# Patient Record
Sex: Male | Born: 1965 | Race: White | Hispanic: No | Marital: Married | Smoking: Current every day smoker
Health system: Southern US, Community
[De-identification: ages and names within clinical notes are randomized; demographics above are authoritative.]

---

## 2004-10-04 ENCOUNTER — Emergency Department: Payer: Self-pay | Admitting: General Practice

## 2014-09-04 ENCOUNTER — Emergency Department: Payer: Self-pay | Admitting: Emergency Medicine

## 2020-03-06 ENCOUNTER — Emergency Department
Admission: EM | Admit: 2020-03-06 | Discharge: 2020-03-06 | Disposition: A | Discharge status: Home / Self Care | Payer: Medicaid Other | Attending: Student | Admitting: Student

## 2020-03-06 ENCOUNTER — Emergency Department: Payer: Medicaid Other

## 2020-03-06 ENCOUNTER — Other Ambulatory Visit: Payer: Self-pay

## 2020-03-06 DIAGNOSIS — S59902A Unspecified injury of left elbow, initial encounter: Secondary | ICD-10-CM | POA: Diagnosis not present

## 2020-03-06 DIAGNOSIS — Y939 Activity, unspecified: Secondary | ICD-10-CM | POA: Insufficient documentation

## 2020-03-06 DIAGNOSIS — Y999 Unspecified external cause status: Secondary | ICD-10-CM | POA: Diagnosis not present

## 2020-03-06 DIAGNOSIS — W010XXA Fall on same level from slipping, tripping and stumbling without subsequent striking against object, initial encounter: Secondary | ICD-10-CM | POA: Insufficient documentation

## 2020-03-06 DIAGNOSIS — Y929 Unspecified place or not applicable: Secondary | ICD-10-CM | POA: Diagnosis not present

## 2020-03-06 DIAGNOSIS — M25422 Effusion, left elbow: Secondary | ICD-10-CM

## 2020-03-06 MED ORDER — TRAMADOL HCL 50 MG PO TABS: 50.0000 mg | ORAL_TABLET | Freq: Two times a day (BID) | ORAL | 0 refills | Status: AC

## 2020-03-06 MED ORDER — CYCLOBENZAPRINE HCL 5 MG PO TABS
5.0000 mg | ORAL_TABLET | Freq: Three times a day (TID) | ORAL | 0 refills | Status: DC | PRN
Start: 2020-03-06 — End: 2020-03-25

## 2020-03-06 NOTE — ED Triage Notes (Signed)
Pt comes via POV from home with c/o left elbow pain. Pt states he tripped and fell on it and hurt it .  Pt states pain and swelling.

## 2020-03-06 NOTE — Discharge Instructions (Addendum)
Your exam and XR are negative for a fracture. You have fluid around the elbow joint. You should wear the splint for comfort and apply ice to reduce swelling. Follow-up with Ortho for ongoing symptoms.

## 2020-03-06 NOTE — ED Notes (Signed)
See triage note. Pt ambulatory to room. Pt stating he fell on his left arm Monday and since the pain has increased. Pt in NAD at this time. Pt rating pain 9/10.

## 2020-03-06 NOTE — ED Provider Notes (Signed)
[facility] Emergency Department Provider Note ____________________________________________  Time seen: 21  I have reviewed the triage vital signs and the nursing notes.  HISTORY  Chief Complaint  left arm pain  HPI Richard Moran is a 53 y.o. male presents himself to the ED for evaluation of left elbow pain following a mechanical fall.   Patient describes he fell on his left elbow, after he tripped and fell.  He presents with pain and swelling to the elbow.  He denies any head injury, loss of consciousness, distal paresthesias, grip changes.  No other injuries are reported at this time.  History reviewed. No pertinent past medical history.  There are no problems to display for this patient.   History reviewed. No pertinent surgical history.  Prior to Admission medications   Medication Sig Start Date End Date Taking? Authorizing Provider  cyclobenzaprine (FLEXERIL) 5 MG tablet Take 1 tablet (5 mg total) by mouth 3 (three) times daily as needed. 03/06/20   Ladamien Rammel, Charlesetta Ivory, PA-C  traMADol (ULTRAM) 50 MG tablet Take 1 tablet (50 mg total) by mouth 2 (two) times daily for 5 days. 03/06/20 03/11/20  Amaurie Schreckengost, Charlesetta Ivory, PA-C    Allergies Patient has no known allergies.  No family history on file.  Social History Social History   Tobacco Use  . Smoking status: Not on file  Substance Use Topics  . Alcohol use: Not on file  . Drug use: Not on file    Review of Systems  Constitutional: Negative for fever. Eyes: Negative for visual changes. ENT: Negative for sore throat. Cardiovascular: Negative for chest pain. Respiratory: Negative for shortness of breath. Gastrointestinal: Negative for abdominal pain, vomiting and diarrhea. Genitourinary: Negative for dysuria. Musculoskeletal: Negative for back pain.  Left elbow pain as above. Skin: Negative for rash. Neurological: Negative for headaches, focal weakness or  numbness. ____________________________________________  PHYSICAL EXAM:  VITAL SIGNS: ED Triage Vitals [03/06/20 1749]  Enc Vitals Group     BP 125/84     Pulse Rate 95     Resp 18     Temp 98.2 F (36.8 C)     Temp src      SpO2 96 %     Weight 145 lb (65.8 kg)     Height 5\' 9"  (1.753 m)     Head Circumference      Peak Flow      Pain Score 10     Pain Loc      Pain Edu?      Excl. in [place]?     Constitutional: Alert and oriented. Well appearing and in no distress. Head: Normocephalic and atraumatic. Eyes: Conjunctivae are normal. Normal extraocular movements Cardiovascular: Normal rate, regular rhythm. Normal distal pulses. Respiratory: Normal respiratory effort.  Musculoskeletal: Left elbow without any obvious deformity or dislocation.  Patient does have a small joint effusion appreciated.  Pain is increased with attempts to fully extend the elbow.  Normal composite fist distally.  Normal pronation and supination range of the forearm.  Nontender with normal range of motion in all extremities.  Neurologic: Cranial nerves II through XII grossly intact.  Normal intrinsic and opposition testing noted.  Normal gait without ataxia. Normal speech and language. No gross focal neurologic deficits are appreciated. Skin:  Skin is warm, dry and intact. No rash noted. ____________________________________________   RADIOLOGY  DG Left Elbow IMPRESSION: 1. Elbow effusion, without evidence of displaced fracture. 2. Dorsal soft tissue swelling. ____________________________________________  PROCEDURES  Jones-Watson wrap to the elbow.  Procedures ____________________________________________  INITIAL IMPRESSION / ASSESSMENT AND PLAN / ED COURSE  Patient with ED evaluation of left elbow pain and swelling after mechanical fall.  Patient's x-rays negative for any acute fracture or dislocation.  Patient does have a mild elbow effusion and some underlying osteophytes of the elbow.  He is  placed in a Jones Watson wrap to the elbow for comfort.  He is referred to Ortho for ongoing management of his symptoms.  Prescriptions for cyclobenzaprine and tramadol are provided for his benefit peer return precautions have been reviewed.  Richard Moran was evaluated in Emergency Department on 03/08/2020 for the symptoms described in the history of present illness. He was evaluated in the context of the global COVID-19 pandemic, which necessitated consideration that the patient might be at risk for infection with the SARS-CoV-2 virus that causes COVID-19. Institutional protocols and algorithms that pertain to the evaluation of patients at risk for COVID-19 are in a state of rapid change based on information released by regulatory bodies including the CDC and federal and state organizations. These policies and algorithms were followed during the patient's care in the ED.  I reviewed the patient's prescription history over the last 12 months in the multi-state controlled substances database(s) that includes [place], [place], [place], [place], [place], [place], [place], [place], [place], [place], [place], [place], [place], and [place].  Results were notable for no RX history.  ____________________________________________  FINAL CLINICAL IMPRESSION(S) / ED DIAGNOSES  Final diagnoses:  Elbow effusion, left  Elbow injury, left, initial encounter      Melvenia Needles, PA-C 03/08/20 0039    Lilia Pro., MD 03/08/20 [phone]

## 2020-03-25 ENCOUNTER — Emergency Department
Admission: EM | Admit: 2020-03-25 | Discharge: 2020-03-25 | Disposition: A | Discharge status: Home / Self Care | Payer: Medicaid Other | Attending: Emergency Medicine | Admitting: Emergency Medicine

## 2020-03-25 ENCOUNTER — Encounter: Payer: Self-pay | Admitting: Emergency Medicine

## 2020-03-25 ENCOUNTER — Other Ambulatory Visit: Payer: Self-pay

## 2020-03-25 ENCOUNTER — Emergency Department: Payer: Medicaid Other

## 2020-03-25 DIAGNOSIS — S4991XA Unspecified injury of right shoulder and upper arm, initial encounter: Secondary | ICD-10-CM | POA: Diagnosis present

## 2020-03-25 DIAGNOSIS — F17210 Nicotine dependence, cigarettes, uncomplicated: Secondary | ICD-10-CM | POA: Diagnosis not present

## 2020-03-25 DIAGNOSIS — M25511 Pain in right shoulder: Secondary | ICD-10-CM | POA: Diagnosis not present

## 2020-03-25 DIAGNOSIS — W1830XA Fall on same level, unspecified, initial encounter: Secondary | ICD-10-CM | POA: Diagnosis not present

## 2020-03-25 DIAGNOSIS — Y999 Unspecified external cause status: Secondary | ICD-10-CM | POA: Insufficient documentation

## 2020-03-25 DIAGNOSIS — Y93K1 Activity, walking an animal: Secondary | ICD-10-CM | POA: Diagnosis not present

## 2020-03-25 DIAGNOSIS — Y929 Unspecified place or not applicable: Secondary | ICD-10-CM | POA: Diagnosis not present

## 2020-03-25 MED ORDER — LIDOCAINE 5 % EX PTCH
1.0000 | MEDICATED_PATCH | CUTANEOUS | Status: DC
  Administered 2020-03-25: 1 via TRANSDERMAL
  Filled 2020-03-25: qty 1

## 2020-03-25 MED ORDER — NAPROXEN 500 MG PO TABS: 500.0000 mg | ORAL_TABLET | Freq: Two times a day (BID) | ORAL | Status: AC

## 2020-03-25 MED ORDER — TRAMADOL HCL 50 MG PO TABS: 50.0000 mg | ORAL_TABLET | Freq: Four times a day (QID) | ORAL | 0 refills | Status: AC | PRN

## 2020-03-25 NOTE — ED Provider Notes (Signed)
[facility] Emergency Department Provider Note   ____________________________________________   First MD Initiated Contact with Patient 03/25/20 1029     (approximate)  I have reviewed the triage vital signs and the nursing notes.   HISTORY  Chief Complaint Shoulder Pain    HPI Richard Moran is a 54 y.o. male patient complain of right shoulder pain secondary to fall yesterday.  Patient state he was walking his dog when his feet slipped out from under him patient that he landed on his right shoulder.  Patient denies LOC or head injury.  Rates pain as a 4/10.  Described pain is "achy".  No palliative measure prior to arrival.         History reviewed. No pertinent past medical history.  There are no problems to display for this patient.   History reviewed. No pertinent surgical history.  Prior to Admission medications   Medication Sig Start Date End Date Taking? Authorizing Provider  cyclobenzaprine (FLEXERIL) 5 MG tablet Take 1 tablet (5 mg total) by mouth 3 (three) times daily as needed. 03/06/20   Menshew, Dannielle Karvonen, PA-C  naproxen (NAPROSYN) 500 MG tablet Take 1 tablet (500 mg total) by mouth 2 (two) times daily with a meal. 03/25/20   Sable Feil, PA-C  traMADol (ULTRAM) 50 MG tablet Take 1 tablet (50 mg total) by mouth every 6 (six) hours as needed for moderate pain. 03/25/20   Sable Feil, PA-C    Allergies Patient has no known allergies.  No family history on file.  Social History Social History   Tobacco Use  . Smoking status: Current Every Day Smoker  . Smokeless tobacco: Never Used  Substance Use Topics  . Alcohol use: Yes  . Drug use: Not on file    Review of Systems Constitutional: No fever/chills Eyes: No visual changes. ENT: No sore throat. Cardiovascular: Denies chest pain. Respiratory: Denies shortness of breath. Gastrointestinal: No abdominal pain.  No nausea, no vomiting.  No diarrhea.  No  constipation. Genitourinary: Negative for dysuria. Musculoskeletal: Right shoulder pain. Skin: Negative for rash. Neurological: Negative for headaches, focal weakness or numbness.   ____________________________________________   PHYSICAL EXAM:  VITAL SIGNS: ED Triage Vitals [03/25/20 0929]  Enc Vitals Group     BP (!) 142/75     Pulse Rate (!) 59     Resp 18     Temp 98.3 F (36.8 C)     Temp Source Oral     SpO2 98 %     Weight 150 lb (68 kg)     Height 6' (1.829 m)     Head Circumference      Peak Flow      Pain Score      Pain Loc      Pain Edu?      Excl. in [place]?     Constitutional: Alert and oriented. Well appearing and in no acute distress. Cardiovascular: Normal rate, regular rhythm. Grossly normal heart sounds.  Good peripheral circulation. Respiratory: Normal respiratory effort.  No retractions. Lungs CTAB. Musculoskeletal: No obvious deformity to the right shoulder.  Patient decreased range of motion with abduction and overhead reaching.  Moderate guarding palpation GH joint.   Neurologic:  Normal speech and language. No gross focal neurologic deficits are appreciated. No gait instability. Skin:  Skin is warm, dry and intact. No rash noted. Psychiatric: Mood and affect are normal. Speech and behavior are normal.  ____________________________________________   LABS (all labs  ordered are listed, but only abnormal results are displayed)  Labs Reviewed - No data to display ____________________________________________  EKG   ____________________________________________  RADIOLOGY  ED MD interpretation:    Official radiology report(s): DG Shoulder Right  Result Date: 03/25/2020 CLINICAL DATA:  Right shoulder pain after fall EXAM: RIGHT SHOULDER - 2+ VIEW COMPARISON:  None. FINDINGS: Suspected spurring along the inferior glenoid, spurring is favored. No appreciable fracture. No malalignment at the [facility] joint. Mild spurring at the [facility] joint. IMPRESSION:  Glenohumeral and mild AC joint spurring.  No acute findings. Electronically Signed   By: Gaylyn Rong M.D.   On: 03/25/2020 11:03    ____________________________________________   PROCEDURES  Procedure(s) performed (including Critical Care):  Procedures   ____________________________________________   INITIAL IMPRESSION / ASSESSMENT AND PLAN / ED COURSE  As part of my medical decision making, I reviewed the following data within the electronic MEDICAL RECORD NUMBER     Patient presents with right shoulder pain secondary to a fall last night.  Discussed no acute findings on x-ray of the right shoulder.  Patient placed in arm sling and a Lidoderm patch applied to area of concern.  Patient given discharge care instruction advised take medication as directed.  Patient advised [facility] care with [facility].  Return to ED if condition worsens.    Richard Moran was evaluated in Emergency Department on 03/25/2020 for the symptoms described in the history of present illness. He was evaluated in the context of the global COVID-19 pandemic, which necessitated consideration that the patient might be at risk for infection with the SARS-CoV-2 virus that causes COVID-19. Institutional protocols and algorithms that pertain to the evaluation of patients at risk for COVID-19 are in a state of rapid change based on information released by regulatory bodies including the CDC and federal and state organizations. These policies and algorithms were followed during the patient's care in the ED.       ____________________________________________   FINAL CLINICAL IMPRESSION(S) / ED DIAGNOSES  Final diagnoses:  Acute pain of right shoulder     ED Discharge Orders         Ordered    naproxen (NAPROSYN) 500 MG tablet  2 times daily with meals     Discontinue  Reprint     03/25/20 1126    traMADol (ULTRAM) 50 MG tablet  Every 6 hours PRN     Discontinue  Reprint     03/25/20 1126             Note:  This document was prepared using Dragon voice recognition software and may include unintentional dictation errors.    Joni Reining, PA-C 03/25/20 1130    Dionne Bucy, MD 03/25/20 1439

## 2020-03-25 NOTE — ED Notes (Signed)
See triage note  Presents w/p fall  States he was trying to get his dog  And fell  Landed on right shoulder

## 2020-03-25 NOTE — ED Triage Notes (Signed)
Patient to ER for c/o right shoulder pain after fall yesterday. Patient states he was trying to get control of his dog while walking dog and feet slipped out from under him. Patient denies hitting head, reports landing on right shoulder. Patient ambulatory to triage without difficulty.

## 2020-03-25 NOTE — Discharge Instructions (Addendum)
No acute findings on x-ray of the right shoulder today.  Follow discharge care instruction Wear arm sling.  Take medication as directed.

## 2022-03-13 ENCOUNTER — Emergency Department
Admission: EM | Admit: 2022-03-13 | Discharge: 2022-03-13 | Disposition: A | Discharge status: Home / Self Care | Payer: Medicaid Other | Attending: Emergency Medicine | Admitting: Emergency Medicine

## 2022-03-13 ENCOUNTER — Other Ambulatory Visit: Payer: Self-pay

## 2022-03-13 DIAGNOSIS — K2970 Gastritis, unspecified, without bleeding: Secondary | ICD-10-CM | POA: Insufficient documentation

## 2022-03-13 DIAGNOSIS — K2900 Acute gastritis without bleeding: Secondary | ICD-10-CM

## 2022-03-13 DIAGNOSIS — F17200 Nicotine dependence, unspecified, uncomplicated: Secondary | ICD-10-CM | POA: Diagnosis not present

## 2022-03-13 DIAGNOSIS — R112 Nausea with vomiting, unspecified: Secondary | ICD-10-CM | POA: Diagnosis present

## 2022-03-13 LAB — CBC
HCT: 46.3 % (ref 39.0–52.0)
Hemoglobin: 16.3 g/dL (ref 13.0–17.0)
MCH: 34.8 pg — ABNORMAL HIGH (ref 26.0–34.0)
MCHC: 35.2 g/dL (ref 30.0–36.0)
MCV: 98.7 fL (ref 80.0–100.0)
Platelets: 275 10*3/uL (ref 150–400)
RBC: 4.69 MIL/uL (ref 4.22–5.81)
RDW: 11.8 % (ref 11.5–15.5)
WBC: 7.5 10*3/uL (ref 4.0–10.5)
nRBC: 0 % (ref 0.0–0.2)

## 2022-03-13 LAB — HEPATIC FUNCTION PANEL
ALT: 70 U/L — ABNORMAL HIGH (ref 0–44)
AST: 66 U/L — ABNORMAL HIGH (ref 15–41)
Albumin: 4 g/dL (ref 3.5–5.0)
Alkaline Phosphatase: 75 U/L (ref 38–126)
Bilirubin, Direct: 0.2 mg/dL (ref 0.0–0.2)
Indirect Bilirubin: 0.6 mg/dL (ref 0.3–0.9)
Total Bilirubin: 0.8 mg/dL (ref 0.3–1.2)
Total Protein: 7.9 g/dL (ref 6.5–8.1)

## 2022-03-13 LAB — BASIC METABOLIC PANEL
Anion gap: 10 (ref 5–15)
BUN: <5 mg/dL — ABNORMAL LOW (ref 6–20)
CO2: 21 mmol/L — ABNORMAL LOW (ref 22–32)
Calcium: 9 mg/dL (ref 8.9–10.3)
Chloride: 105 mmol/L (ref 98–111)
Creatinine, Ser: 0.72 mg/dL (ref 0.61–1.24)
GFR, Estimated: >60 mL/min (ref >60)
Glucose, Bld: 112 mg/dL — ABNORMAL HIGH (ref 70–99)
Potassium: 3.7 mmol/L (ref 3.5–5.1)
Sodium: 136 mmol/L (ref 135–145)

## 2022-03-13 LAB — LIPASE, BLOOD: Lipase: 33 U/L (ref 11–51)

## 2022-03-13 MED ORDER — PANTOPRAZOLE SODIUM 40 MG PO TBEC: 40.0000 mg | DELAYED_RELEASE_TABLET | Freq: Every day | ORAL | 1 refills | Status: AC

## 2022-03-13 MED ORDER — SUCRALFATE 1 G PO TABS: 1.0000 g | ORAL_TABLET | Freq: Four times a day (QID) | ORAL | 1 refills | Status: AC

## 2022-03-13 MED ORDER — ALUM & MAG HYDROXIDE-SIMETH 200-200-20 MG/5ML PO SUSP
30.0000 mL | Freq: Once | ORAL | Status: AC
  Administered 2022-03-13: 30 mL via ORAL
  Filled 2022-03-13: qty 30

## 2022-03-13 MED ORDER — LIDOCAINE VISCOUS HCL 2 % MT SOLN
15.0000 mL | Freq: Once | OROMUCOSAL | Status: AC
  Administered 2022-03-13: 15 mL via ORAL
  Filled 2022-03-13: qty 15

## 2022-03-13 MED ORDER — ONDANSETRON 4 MG PO TBDP
4.0000 mg | ORAL_TABLET | Freq: Once | ORAL | Status: AC
  Administered 2022-03-13: 4 mg via ORAL
  Filled 2022-03-13: qty 1

## 2022-03-13 NOTE — ED Provider Notes (Signed)
[facility] Provider Note    Event Date/Time   First MD Initiated Contact with Patient 03/13/22 1501     (approximate)  History   Chief Complaint: Nausea  HPI  Richard Moran is a 56 y.o. male presents to the emergency department with multiple complaints.  Patient states over the past week or 2 he has been feeling nauseated at times as upper abdominal discomfort.  Patient states most mornings he will wake up and vomit.  Patient states a history of heartburn.  Also states he is a daily alcohol drinker.  Does state a few weeks ago he had a tick bite.  But denies any fever.  No rash.  Patient states he feels like he cannot eat because he feels "full" all the time and now he feels like he is getting weak because he is not able to eat.  Physical Exam   Triage Vital Signs: ED Triage Vitals  Enc Vitals Group     BP 03/13/22 1423 (!) 164/114     Pulse Rate 03/13/22 1423 100     Resp 03/13/22 1423 18     Temp 03/13/22 1423 97.8 F (36.6 C)     Temp Source 03/13/22 1423 Oral     SpO2 03/13/22 1423 99 %     Weight 03/13/22 1424 150 lb (68 kg)     Height 03/13/22 1424 6' (1.829 m)     Head Circumference --      Peak Flow --      Pain Score 03/13/22 1423 3     Pain Loc --      Pain Edu? --      Excl. in [place]? --     Most recent vital signs: Vitals:   03/13/22 1423  BP: (!) 164/114  Pulse: 100  Resp: 18  Temp: 97.8 F (36.6 C)  SpO2: 99%    General: Awake, no distress.  CV:  Good peripheral perfusion.  Regular rate and rhythm  Resp:  Normal effort.  Equal breath sounds bilaterally.  Abd:  No distention.  Soft, nontender.  No rebound or guarding.  Benign abdominal exam    ED Results / Procedures / Treatments   MEDICATIONS ORDERED IN ED: Medications  alum & mag hydroxide-simeth (MAALOX/MYLANTA) 200-200-20 MG/5ML suspension 30 mL (has no administration in time range)    And  lidocaine (XYLOCAINE) 2 % viscous mouth solution 15 mL (has no  administration in time range)  ondansetron (ZOFRAN-ODT) disintegrating tablet 4 mg (has no administration in time range)     IMPRESSION / MDM / ASSESSMENT AND PLAN / ED COURSE  I reviewed the triage vital signs and the nursing notes.  Patient's presentation is most consistent with acute presentation with potential threat to life or bodily function.  Patient presents emergency department with complaints of nausea and feeling full.  Patient states most mornings he will spit up or vomit.  Patient is a daily smoker as well as daily drinker.  Denies any over-the-counter pain medications.  Patient's symptoms are very suggestive of gastritis.  We will dose a GI cocktail and ODT Zofran.  I have added on hepatic function panel and lipase as a precaution.  Differential would include gastritis, gastroenteritis, pancreatitis, less likely biliary pathology.  Reassuringly patient has a benign abdomen.  CBC is normal with a normal white blood cell count.  BMP is normal with normal renal function.  Patient has no chest pain or shortness of breath.  Patient's hepatic function  panel shows mild LFT elevation otherwise no concerning acute abnormality.  Total bilirubin is normal.  Lipase negative.  Highly suspect gastritis.  We will prescribe sucralfate and Protonix have the patient follow-up with GI medicine.  Patient agreeable to plan of care.  FINAL CLINICAL IMPRESSION(S) / ED DIAGNOSES   Gastritis  Rx / DC Orders   Protonix Sucralfate  Note:  This document was prepared using Dragon voice recognition software and may include unintentional dictation errors.   Harvest Dark, MD 03/13/22 1546

## 2022-03-13 NOTE — ED Notes (Signed)
Pt states he has had leaking stool several times a day and constantly feels "full" and has no appetite.

## 2022-03-13 NOTE — ED Triage Notes (Signed)
Pt to ED via POV from home. Pt reports nausea, abdominal discomfort, neck pain, hot flashes and decreased appetite for a few months. Pt also states symptoms worsened after finding a tick on his left hand.

## 2022-03-28 IMAGING — CR DG SHOULDER 2+V*R*
1 series · 3 of 3 positions shown · non-contrast
Comparison: None.

CLINICAL DATA: Right shoulder pain after fall

EXAM:
RIGHT SHOULDER - 2+ VIEW

[Series 1: dg shoulder right · 0.14mm/px · 3 of 3 slices shown]
[im 1/3]
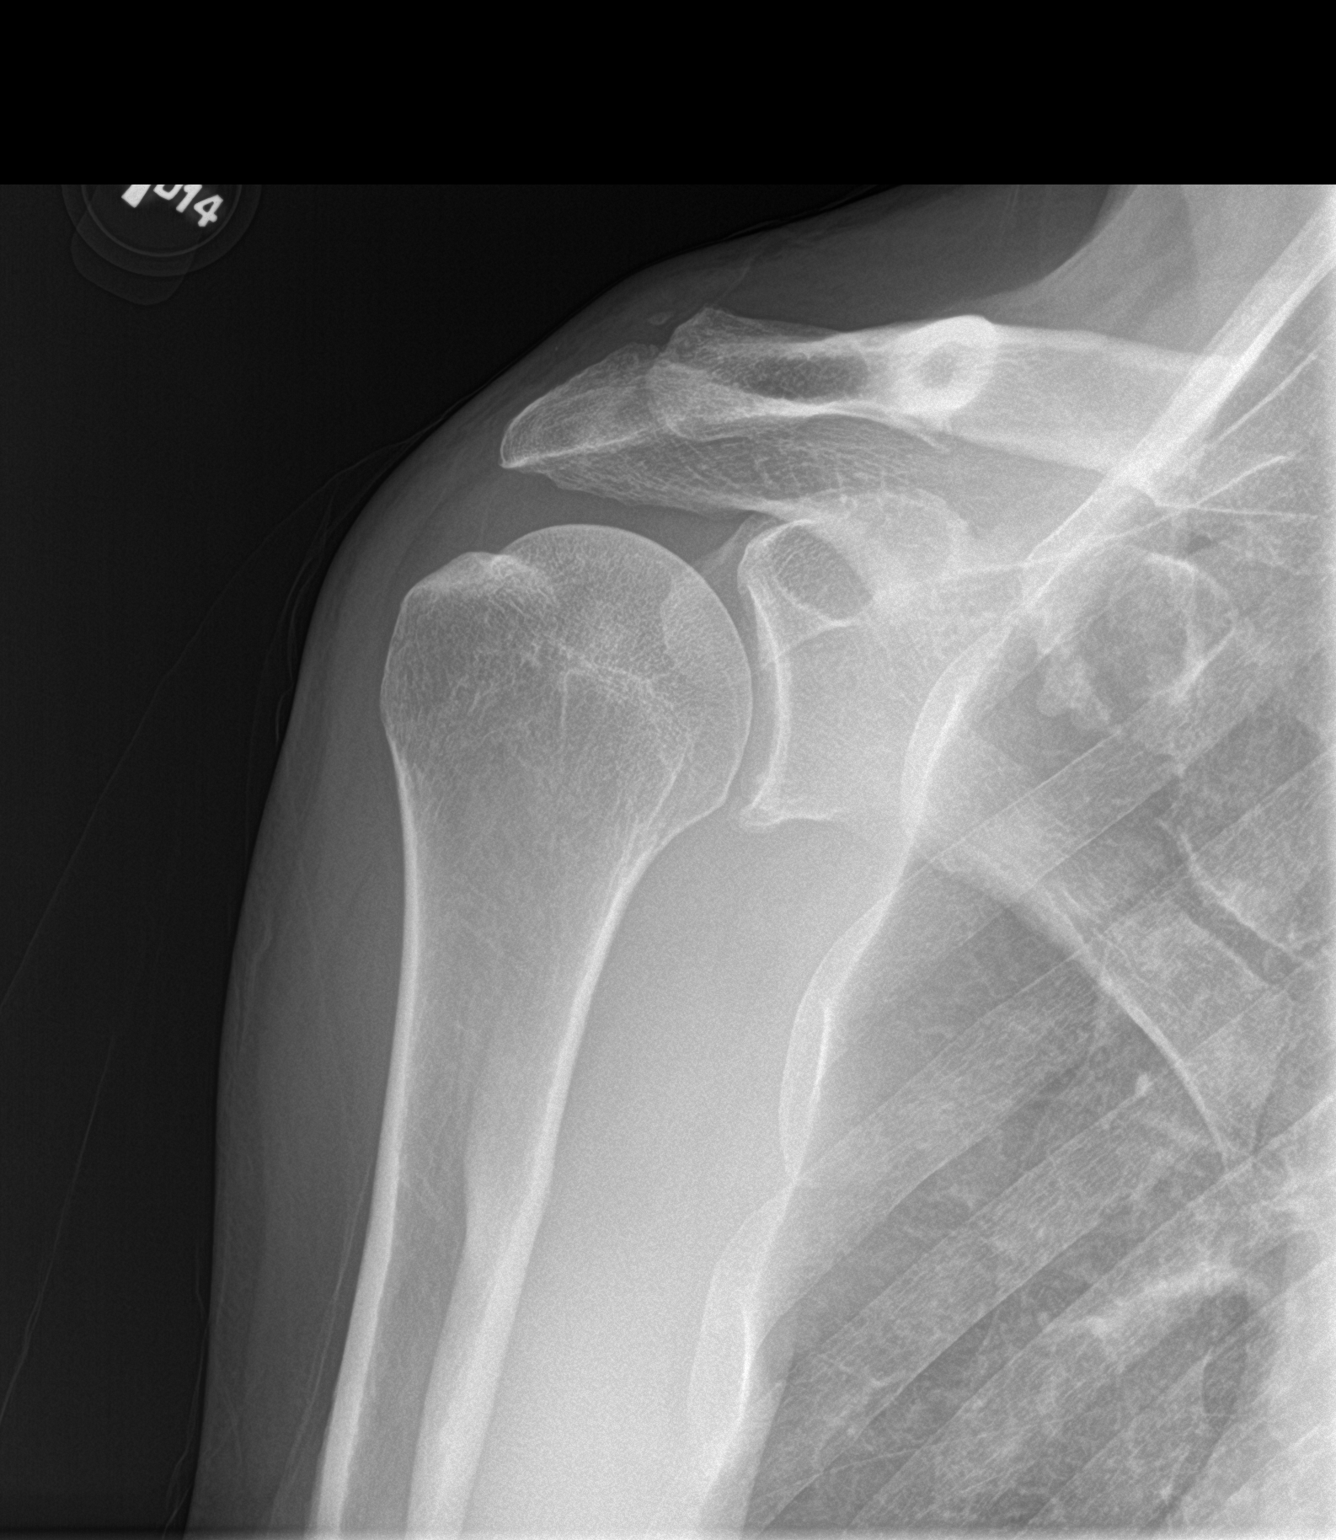
[im 2/3]
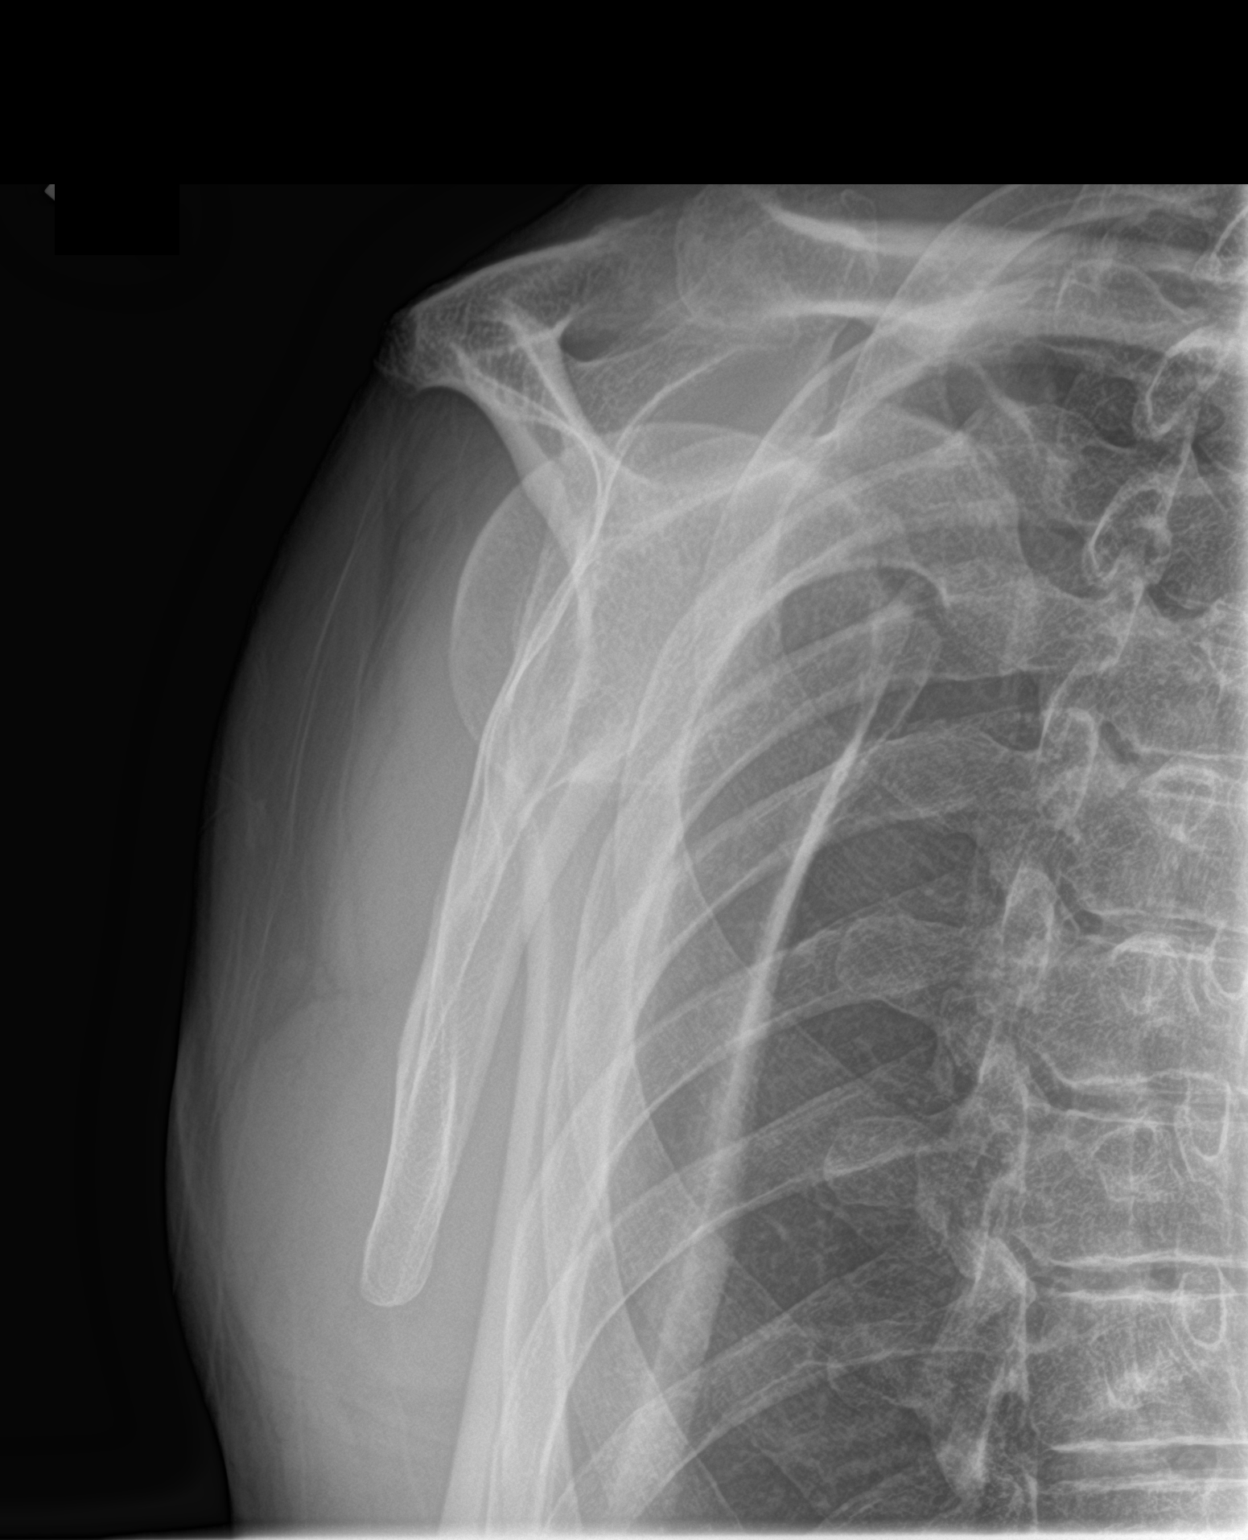
[im 3/3]
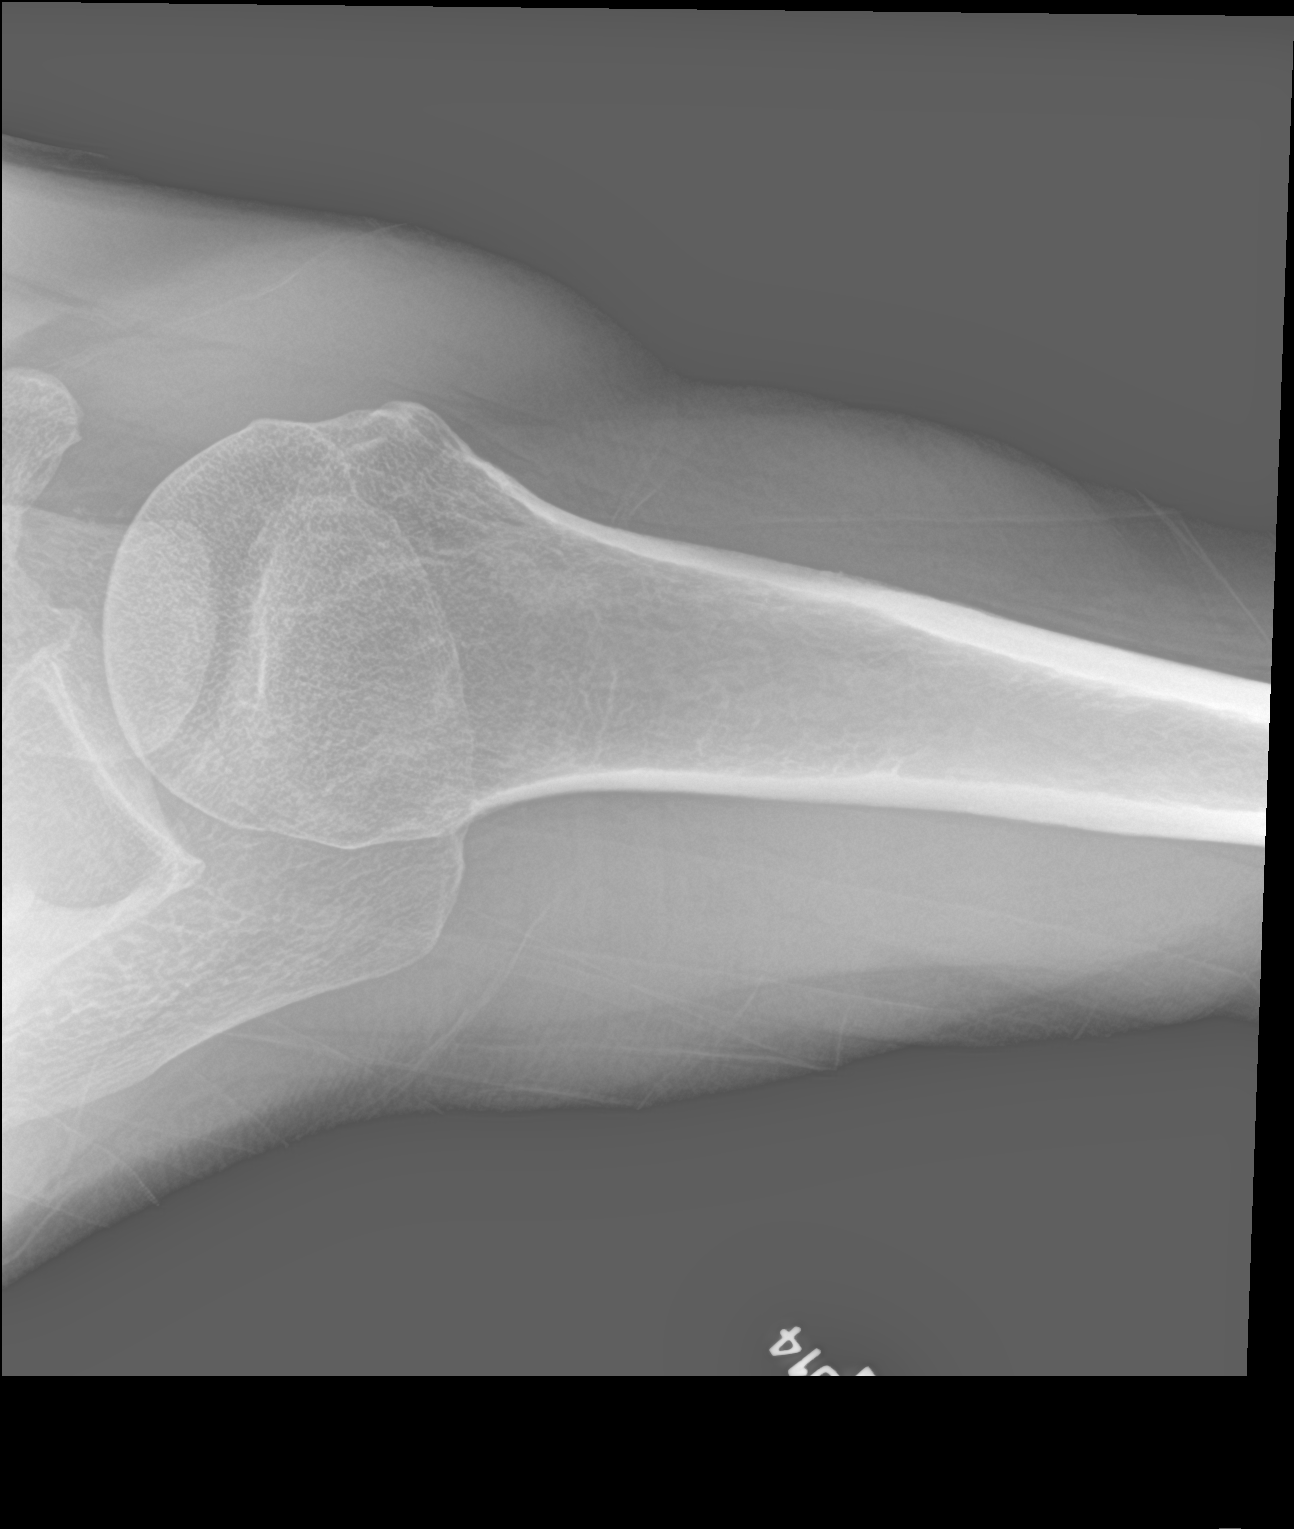

[3 of 3 positions shown; findings below may reference images not displayed]

FINDINGS: Suspected spurring along the inferior glenoid, spurring is favored.
No appreciable fracture. No malalignment at the AC joint. Mild
spurring at the AC joint.
IMPRESSION: Glenohumeral and mild AC joint spurring.  No acute findings.

## 2024-09-26 ENCOUNTER — Encounter: Payer: Self-pay | Admitting: Emergency Medicine

## 2024-09-26 ENCOUNTER — Emergency Department
Admission: EM | Admit: 2024-09-26 | Discharge: 2024-09-26 | Disposition: A | Discharge status: Home / Self Care | Source: Home / Self Care | Attending: Emergency Medicine | Admitting: Emergency Medicine

## 2024-09-26 ENCOUNTER — Other Ambulatory Visit: Payer: Self-pay

## 2024-09-26 DIAGNOSIS — H6123 Impacted cerumen, bilateral: Secondary | ICD-10-CM

## 2024-09-26 MED ORDER — DOCUSATE SODIUM 100 MG PO CAPS
200.0000 mg | ORAL_CAPSULE | Freq: Once | ORAL | Status: AC
  Administered 2024-09-26: 12:00:00 200 mg via ORAL
  Filled 2024-09-26: qty 2

## 2024-09-26 MED ORDER — DOCUSATE SODIUM 50 MG/5ML PO LIQD
50.0000 mg | Freq: Once | ORAL | Status: AC
  Administered 2024-09-26: 13:00:00 50 mg via ORAL
  Filled 2024-09-26: qty 10

## 2024-09-26 NOTE — ED Triage Notes (Signed)
 C?O being unable to hear out of either ear x 10 days.  States had been having trouble hearing and put ear drops into both ears for ear wax, and since then, unable to hear.

## 2024-09-26 NOTE — ED Notes (Signed)
 Right ear irrigated with 20 ml's of warm water  Moderate amt of wax removed   States hearing is improved on the right Attempt to irrigated the left w/o success   Provider aware

## 2024-09-26 NOTE — Discharge Instructions (Signed)
 Call and schedule appointment with the ear, nose, and throat specialist.  Return to the emergency department for symptoms that change, worsen, or for new concerns if you are unable to see primary care or the specialist.

## 2024-09-26 NOTE — ED Provider Notes (Signed)
 [facility] Provider Note    Event Date/Time   First MD Initiated Contact with Patient 09/26/24 1203     (approximate)   History   Hearing Problem   HPI  Richard Moran is a 58 y.o. male with no contributory past medical history and as listed in EMR presents to the emergency department for treatment and evaluation of decreased ability to hear from both ears. Symptoms have been gradually worsening over the past month but acutely worsened after using OTC ear wax softener. No otalgia or fever.   Physical Exam    Vitals:   09/26/24 1141  BP: (!) 160/85  Pulse: 87  Resp: 16  Temp: 97.9 F (36.6 C)  SpO2: 98%    General: Awake, no distress.  CV:  Good peripheral perfusion.  Resp:  Normal effort.  Abd:  No distention.  Other:  Bilateral cerumen impaction.   ED Results / Procedures / Treatments   Labs (all labs ordered are listed, but only abnormal results are displayed)  Labs Reviewed - No data to display   EKG  Not indicated.   RADIOLOGY  Image and radiology report reviewed and interpreted by me. Radiology report consistent with the same.  Not indicated.  PROCEDURES:  Critical Care performed: No  Ear Cerumen Removal  Date/Time: 09/26/2024 2:41 PM  Performed by: Herlinda Kirk NOVAK, FNP Authorized by: Herlinda Kirk NOVAK, FNP   Consent:    Consent obtained:  Verbal   Consent given by:  Patient   Risks discussed:  Bleeding, pain, incomplete removal, dizziness and TM perforation Universal protocol:    Patient identity confirmed:  Verbally with patient Procedure details:    Location:  L ear and R ear   Procedure type: irrigation     Successful cerumen removal: Cerumen removed from the right ear, unable to clear impaction on the left side.   Post-procedure details:    Inspection:  No bleeding (Right TM intact)   Hearing quality:  Improved   Procedure completion:  Tolerated well, no immediate complications    MEDICATIONS  ORDERED IN ED:  Medications  docusate sodium  (COLACE) capsule 200 mg (200 mg Oral Given 09/26/24 1223)  docusate (COLACE) 50 MG/5ML liquid 50 mg (50 mg Oral Given 09/26/24 1313)     IMPRESSION / MDM / ASSESSMENT AND PLAN / ED COURSE   I have reviewed the triage note and vital signs. Vital signs stable   Differential diagnosis includes, but is not limited to, cerumen impaction, serous otitis media, tympanic membrane rupture, presbycusis  Patient's presentation is most consistent with acute illness / injury with system symptoms.  58 year old male presenting to the emergency department for treatment and evaluation of gradual decrease in ability to hear thought to be secondary to cerumen impaction.  See HPI for further details.  On exam, he does have bilateral cerumen impaction.  Colace ordered.   Successful removal of cerumen from right ear.  Large amount of cerumen removed and patient reported immediate improvement in hearing.  Left side more difficult.  Small amount of cerumen removed however significant impaction remained.  Colace again ordered and will reattempt after about 15 minutes.   Patient requesting discharge.  He was advised to follow-up with ENT.  He states that he will call today to schedule an appointment.  ER return precautions discussed.      FINAL CLINICAL IMPRESSION(S) / ED DIAGNOSES   Final diagnoses:  Bilateral impacted cerumen     Rx / DC Orders  ED Discharge Orders     None        Note:  This document was prepared using Dragon voice recognition software and may include unintentional dictation errors.   Herlinda Kirk NOVAK, FNP 09/26/24 1445    Willo Dunnings, MD 09/26/24 1501
